# Patient Record
Sex: Male | Born: 1995 | Race: Black or African American | Hispanic: No | Marital: Single
Health system: Southern US, Community
[De-identification: ages and names within clinical notes are randomized; demographics above are authoritative.]

---
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
## 2019-10-10 ENCOUNTER — Ambulatory Visit: Payer: BC Managed Care – PPO | Admitting: Podiatry

## 2019-10-10 ENCOUNTER — Other Ambulatory Visit: Payer: Self-pay

## 2019-10-10 DIAGNOSIS — B351 Tinea unguium: Secondary | ICD-10-CM

## 2019-10-10 MED ORDER — TERBINAFINE HCL 250 MG PO TABS: 250.0000 mg | ORAL_TABLET | Freq: Every day | ORAL | 0 refills | Status: AC

## 2019-10-14 NOTE — Progress Notes (Signed)
   Subjective: 23 y.o. male presenting today as a new patient with a chief complaint of possible nail fungus to the bilateral great toes that has been ongoing for the past year. He reports associated thickening and discoloration. He denies modifying factors and has been trying to keep it trimmed down for treatment. Patient is here for further evaluation and treatment.   No past medical history on file.  Objective: Physical Exam General: The patient is alert and oriented x3 in no acute distress.  Dermatology: Hyperkeratotic, discolored, thickened, onychodystrophy noted to the bilateral great toenails. Skin is warm, dry and supple bilateral lower extremities. Negative for open lesions or macerations.  Vascular: Palpable pedal pulses bilaterally. No edema or erythema noted. Capillary refill within normal limits.  Neurological: Epicritic and protective threshold grossly intact bilaterally.   Musculoskeletal Exam: Range of motion within normal limits to all pedal and ankle joints bilateral. Muscle strength 5/5 in all groups bilateral.   Assessment: #1 Onychomycosis bilateral great toenails #2 Hyperkeratotic nails bilateral great toenails   Plan of Care:  #1 Patient was evaluated. #2 Prescription for Lamisil 250 mg #90 provided to patient.  #3 Return to clinic as needed.   Going to [employer]. Major in history. Minor in film studies.    Edrick Kins, DPM [facility]  Dr. Edrick Kins, [address]                                        [place], [place] [address]                Office [phone]  Fax [phone]

## 2020-01-10 ENCOUNTER — Ambulatory Visit: Payer: Self-pay | Attending: Internal Medicine

## 2020-01-10 DIAGNOSIS — Z20822 Contact with and (suspected) exposure to covid-19: Secondary | ICD-10-CM | POA: Insufficient documentation

## 2020-01-12 LAB — NOVEL CORONAVIRUS, NAA: SARS-CoV-2, NAA: NOT DETECTED

## 2020-05-09 ENCOUNTER — Ambulatory Visit: Payer: BC Managed Care – PPO | Admitting: Podiatry

## 2020-05-09 ENCOUNTER — Other Ambulatory Visit: Payer: Self-pay

## 2020-05-09 DIAGNOSIS — L600 Ingrowing nail: Secondary | ICD-10-CM | POA: Diagnosis not present

## 2020-05-09 MED ORDER — GENTAMICIN SULFATE 0.1 % EX CREA: 1.0000 "application " | TOPICAL_CREAM | Freq: Two times a day (BID) | CUTANEOUS | 1 refills | Status: AC

## 2020-05-13 NOTE — Progress Notes (Signed)
   Subjective: Patient presents today for evaluation of sharp pain and tenderness to the medial border of the right great toe that.began 2-3 weeks ago. He reports associated discoloration and drainage from the area. Patient is concerned for possible ingrown nail. Walking and driving increases the pain. He has been using Dr. Felicie Morn ingrown pain relief kit. Patient presents today for further treatment and evaluation.  No past medical history on file.  Objective:  General: Well developed, nourished, in no acute distress, alert and oriented x3   Dermatology: Skin is warm, dry and supple bilateral. Medial border of the right great toe appears to be erythematous with evidence of an ingrowing nail. Pain on palpation noted to the border of the nail fold. The remaining nails appear unremarkable at this time. There are no open sores, lesions.  Vascular: Dorsalis Pedis artery and Posterior Tibial artery pedal pulses palpable. No lower extremity edema noted.   Neruologic: Grossly intact via light touch bilateral.  Musculoskeletal: Muscular strength within normal limits in all groups bilateral. Normal range of motion noted to all pedal and ankle joints.   Assesement: #1 Paronychia with ingrowing nail medial border right great toe #2 Pain in toe #3 Incurvated nail  Plan of Care:  1. Patient evaluated.  2. Discussed treatment alternatives and plan of care. Explained nail avulsion procedure and post procedure course to patient. 3. Patient opted for permanent partial nail avulsion of the medial border right great toe.  4. Prior to procedure, local anesthesia infiltration utilized using 3 ml of a 50:50 mixture of 2% plain lidocaine and 0.5% plain marcaine in a normal hallux block fashion and a betadine prep performed.  5. Partial permanent nail avulsion with chemical matrixectomy performed using [id number] applications of phenol followed by alcohol flush.  6. Light dressing applied. 7. Prescription for  Gentamicin cream provided to patient to use daily with a bandage.  8. Return to clinic in 2 weeks.  Edrick Kins, DPM [facility]  Dr. Edrick Kins, [address]                                        [place], [place] [address]                Office [phone]  Fax [phone]

## 2020-05-23 ENCOUNTER — Other Ambulatory Visit: Payer: Self-pay

## 2020-05-23 ENCOUNTER — Ambulatory Visit: Payer: BC Managed Care – PPO | Admitting: Podiatry

## 2020-05-23 DIAGNOSIS — L600 Ingrowing nail: Secondary | ICD-10-CM

## 2020-05-25 NOTE — Progress Notes (Signed)
   Subjective: 24 y.o. male presents today status post permanent nail avulsion procedure of the medial border of the right hallux that was performed on 05/09/2020. He states he is doing well and denies any pain. He has been soaking the toe in Epsom salt as directed. There are no worsening factors noted. Patient is here for further evaluation and treatment.   No past medical history on file.  Objective: Skin is warm, dry and supple. Nail and respective nail fold appears to be healing appropriately. Open wound to the associated nail fold with a granular wound base and moderate amount of fibrotic tissue. Minimal drainage noted. Mild erythema around the periungual region likely due to phenol chemical matricectomy.  Assessment: #1 postop permanent partial nail avulsion medial border right hallux  #2 open wound periungual nail fold of respective digit.   Plan of care: #1 patient was evaluated  #2 debridement of open wound was performed to the periungual border of the respective toe using a currette. Antibiotic ointment and Band-Aid was applied. #3 patient is to return to clinic on a PRN basis.   Felecia Shelling, DPM [facility]  Dr. Felecia Shelling, DPM    [address]                                        [place], [place] [address]                Office [phone]  Fax [phone]

## 2020-08-07 ENCOUNTER — Other Ambulatory Visit: Payer: Self-pay

## 2020-08-07 ENCOUNTER — Ambulatory Visit: Payer: BC Managed Care – PPO | Admitting: Podiatry

## 2020-08-07 DIAGNOSIS — L600 Ingrowing nail: Secondary | ICD-10-CM | POA: Diagnosis not present

## 2020-08-07 NOTE — Patient Instructions (Signed)

## 2020-08-07 NOTE — Progress Notes (Signed)
   Subjective: Patient presents today for evaluation of pain to the medial border left great toe. Patient is concerned for possible ingrown nail. Patient presents today for further treatment and evaluation.  No past medical history on file.  Objective:  General: Well developed, nourished, in no acute distress, alert and oriented x3   Dermatology: Skin is warm, dry and supple bilateral.  Medial border left great toe appears to be erythematous with evidence of an ingrowing nail. Pain on palpation noted to the border of the nail fold. The remaining nails appear unremarkable at this time. There are no open sores, lesions.  Vascular: Dorsalis Pedis artery and Posterior Tibial artery pedal pulses palpable. No lower extremity edema noted.   Neruologic: Grossly intact via light touch bilateral.  Musculoskeletal: Muscular strength within normal limits in all groups bilateral. Normal range of motion noted to all pedal and ankle joints.   Assesement: #1 Paronychia with ingrowing nail medial border left great toe #2 Pain in toe #3 Incurvated nail  Plan of Care:  1. Patient evaluated.  2. Discussed treatment alternatives and plan of care. Explained nail avulsion procedure and post procedure course to patient. 3. Patient opted for permanent partial nail avulsion of the medial border left great toe.  4. Prior to procedure, local anesthesia infiltration utilized using 3 ml of a 50:50 mixture of 2% plain lidocaine and 0.5% plain marcaine in a normal hallux block fashion and a betadine prep performed.  5. Partial permanent nail avulsion with chemical matrixectomy performed using 3x30sec applications of phenol followed by alcohol flush.  6. Light dressing applied. 7.  Patient has a prescription already of gentamicin cream from an ingrown toenail procedure that he performed to the right great toe in May 2021.  Resume antibiotic cream daily to the area  8.  Return to clinic 2 weeks.  *Major and history.   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  Moving to [facility], DPM [facility]  Dr. Felecia Shelling, DPM    [address]                                        [place], [place] [address]                Office [phone]  Fax [phone]

## 2020-08-20 ENCOUNTER — Ambulatory Visit: Payer: BC Managed Care – PPO | Admitting: Podiatry
